# Patient Record
Sex: Female | Born: 1995 | Race: White | Hispanic: No | Marital: Single | Smoking: Never smoker
Health system: Southern US, Community
[De-identification: ages and names within clinical notes are randomized; demographics above are authoritative.]

## PROBLEM LIST (undated history)

## (undated) DIAGNOSIS — J45909 Unspecified asthma, uncomplicated: Secondary | ICD-10-CM

## (undated) HISTORY — PX: TONSILLECTOMY: SUR1361

## (undated) HISTORY — PX: APPENDECTOMY: SHX54

## (undated) HISTORY — PX: WRIST SURGERY: SHX841

---
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
## 2014-01-25 ENCOUNTER — Ambulatory Visit: Payer: Self-pay | Admitting: Family

## 2015-01-26 ENCOUNTER — Ambulatory Visit
Admission: RE | Admit: 2015-01-26 | Discharge: 2015-01-26 | Disposition: A | Discharge status: Home / Self Care | Payer: BLUE CROSS/BLUE SHIELD | Source: Ambulatory Visit | Attending: Family Medicine | Admitting: Family Medicine

## 2015-01-26 ENCOUNTER — Other Ambulatory Visit: Payer: Self-pay | Admitting: Family Medicine

## 2015-01-26 DIAGNOSIS — R52 Pain, unspecified: Secondary | ICD-10-CM

## 2015-01-26 DIAGNOSIS — M79671 Pain in right foot: Secondary | ICD-10-CM | POA: Insufficient documentation

## 2015-11-30 ENCOUNTER — Encounter: Payer: Self-pay | Admitting: Intensive Care

## 2015-11-30 ENCOUNTER — Emergency Department
Admission: EM | Admit: 2015-11-30 | Discharge: 2015-11-30 | Disposition: A | Discharge status: Home / Self Care | Payer: BLUE CROSS/BLUE SHIELD | Attending: Emergency Medicine | Admitting: Emergency Medicine

## 2015-11-30 DIAGNOSIS — J040 Acute laryngitis: Secondary | ICD-10-CM | POA: Diagnosis not present

## 2015-11-30 DIAGNOSIS — J45909 Unspecified asthma, uncomplicated: Secondary | ICD-10-CM | POA: Diagnosis not present

## 2015-11-30 DIAGNOSIS — J029 Acute pharyngitis, unspecified: Secondary | ICD-10-CM | POA: Diagnosis present

## 2015-11-30 HISTORY — DX: Unspecified asthma, uncomplicated: J45.909

## 2015-11-30 LAB — MONONUCLEOSIS SCREEN: Mono Screen: NEGATIVE

## 2015-11-30 LAB — POCT RAPID STREP A: STREPTOCOCCUS, GROUP A SCREEN (DIRECT): NEGATIVE

## 2015-11-30 MED ORDER — PREDNISONE 10 MG (21) PO TBPK: ORAL_TABLET | ORAL | 0 refills | Status: AC

## 2015-11-30 MED ORDER — ACETAMINOPHEN-CODEINE #3 300-30 MG PO TABS: 1.0000 | ORAL_TABLET | ORAL | 0 refills | Status: AC | PRN

## 2015-11-30 NOTE — ED Triage Notes (Signed)
Pt presents to ER with hoarseness and sore throat X 10 days. Pt was treated for strep last week and was put on a 10 day antibiotic (penicillin) and finished it Sunday. Pt states it started to get better but then went back to hurting and has gotten worse. Pt ambulatory in triage with NAD noted. Denies chest pain or trouble swallowing. Painful when swallowing.

## 2015-11-30 NOTE — ED Notes (Signed)
Pt informed to return if any life threatening symptoms occur.  

## 2015-11-30 NOTE — Discharge Instructions (Signed)
Take ibuprofen in addition to the prescription medications written today. Your strep test and mono was negative today. Return to the ER for symptoms that change or worsen if you are unable to see your primary care provider or the ENT specialist.

## 2015-11-30 NOTE — ED Provider Notes (Signed)
[facility] Emergency Department Provider Note  ____________________________________________  Time seen: Approximately 8:17 AM  I have reviewed the triage vital signs and the nursing notes.   HISTORY  Chief Complaint Sore Throat    HPI Kiara Hill is a 20 y.o. female who presents to the emergency department with hoarseness and sore throat x 10 days. She has completed a round of amoxicillin and had felt some better, but symptoms changed overnight and she feels bad again. Today she has another sore throat, hoarseness, and generalized body aches. She did not take any medications prior to arrival.  Past Medical History:  Diagnosis Date  . Asthma     There are no active problems to display for this patient.   Past Surgical History:  Procedure Laterality Date  . APPENDECTOMY    . 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 TONSILLECTOMY    . WRIST SURGERY Left     Prior to Admission medications   Medication Sig Start Date End Date Taking? Authorizing Provider  acetaminophen-codeine (TYLENOL #3) 300-30 MG tablet Take 1-2 tablets by mouth every 4 (four) hours as needed for moderate pain. 11/30/15   Chinita Pester, FNP  predniSONE (STERAPRED UNI-PAK 21 TAB) 10 MG (21) TBPK tablet Take 6 tablets on day 1 Take 5 tablets on day 2 Take 4 tablets on day 3 Take 3 tablets on day 4 Take 2 tablets on day 5 Take 1 tablet on day 6 11/30/15   Chinita Pester, FNP    Allergies Review of patient's allergies indicates no known allergies.  History reviewed. No pertinent family history.  Social History Social History  Substance Use Topics  . Smoking status: Never Smoker  . Smokeless tobacco: Never Used  . Alcohol use Yes     Comment: Socially    Review of Systems Constitutional: Negative for fever. Eyes: No visual changes. ENT: Positive for sore throat; negative for difficulty swallowing. Positive for hoarseness. Respiratory: Denies shortness of  breath. Denies cough. Gastrointestinal: No abdominal pain.  No nausea, no vomiting.  No diarrhea.  Genitourinary: Negative for dysuria. Musculoskeletal: Positive for generalized body aches. Skin: negative for rash. Neurological: Negative for headaches, focal weakness or numbness.  ____________________________________________   PHYSICAL EXAM:  VITAL SIGNS: ED Triage Vitals [11/30/15 0740]  Enc Vitals Group     BP 125/83     Pulse Rate 91     Resp 18     Temp 98.3 F (36.8 C)     Temp Source Oral     SpO2 98 %     Weight 135 lb (61.2 kg)     Height 5\' 10"  (1.778 m)     Head Circumference      Peak Flow      Pain Score 7     Pain Loc      Pain Edu?      Excl. in GC?    Constitutional: Alert and oriented. Well appearing and in no acute distress. Eyes: Conjunctivae are normal. PERRL. EOMI. Head: Atraumatic. Nose: No congestion/rhinnorhea. Mouth/Throat: Mucous membranes are moist. Oropharynx mildly erythematous, without exudate. Neck: No stridor.  Lymphatic: Lymphadenopathy: Tender submandibular nodes without submental node palpable. Cardiovascular: Normal rate, regular rhythm. Good peripheral circulation. Respiratory: Normal respiratory effort. Lungs CTAB. Gastrointestinal: Soft and nontender. Musculoskeletal: No lower extremity tenderness nor edema.   Neurologic:  Normal speech and language. No gross focal neurologic deficits are appreciated. Speech is normal. No gait instability. Skin:  Skin is  warm, dry and intact. No rash noted Psychiatric: Mood and affect are normal. Speech and behavior are normal.  ____________________________________________   LABS (all labs ordered are listed, but only abnormal results are displayed)  Labs Reviewed  MONONUCLEOSIS SCREEN  POCT RAPID STREP A   ____________________________________________  EKG   ____________________________________________  RADIOLOGY  Not  indicated. ____________________________________________   PROCEDURES  Procedure(s) performed: None  Critical Care performed: No  ____________________________________________   INITIAL IMPRESSION / ASSESSMENT AND PLAN / ED COURSE  Clinical Course    Pertinent labs & imaging results that were available during my care of the patient were reviewed by me and considered in my medical decision making (see chart for details).  Rapid strep and Mono is negative. Will prescribe tylenol 3 and prednisone. She is to follow up with ENT for symptoms that are not improving over the weekend. She is to return to the ER for symptoms that change or worsen if unable to schedule an appointment. ____________________________________________   FINAL CLINICAL IMPRESSION(S) / ED DIAGNOSES  Final diagnoses:  Laryngitis  Sore throat    Note:  This document was prepared using Dragon voice recognition software and may include unintentional dictation errors.    Chinita PesterCari B Tanasia Budzinski, FNP 11/30/15 1428    Sharyn CreamerMark Quale, MD 11/30/15 [phone]

## 2017-04-30 IMAGING — CR DG FOOT COMPLETE 3+V*R*
1 series · 3 of 3 positions shown · non-contrast
Comparison: None in PACs

CLINICAL DATA: Right foot pain centered over the fourth and fifth
digits following injury 4 days ago.

EXAM:
RIGHT FOOT COMPLETE - 3+ VIEW

[Series 1: dg foot complete right · 0.14mm/px · 3 of 3 slices shown]
[im 1/3]
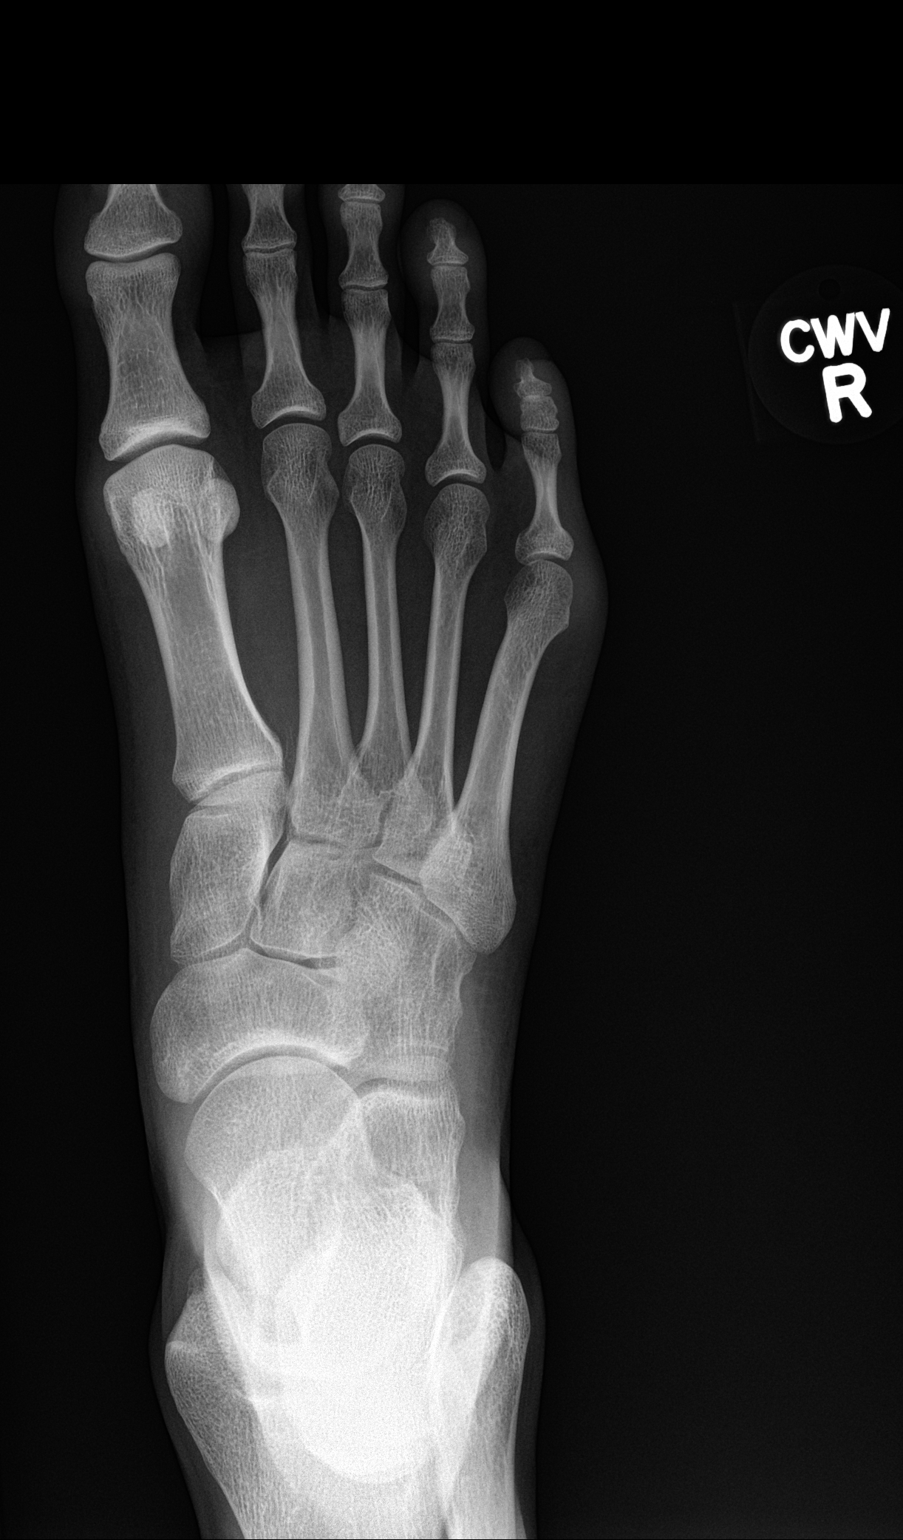
[im 2/3]
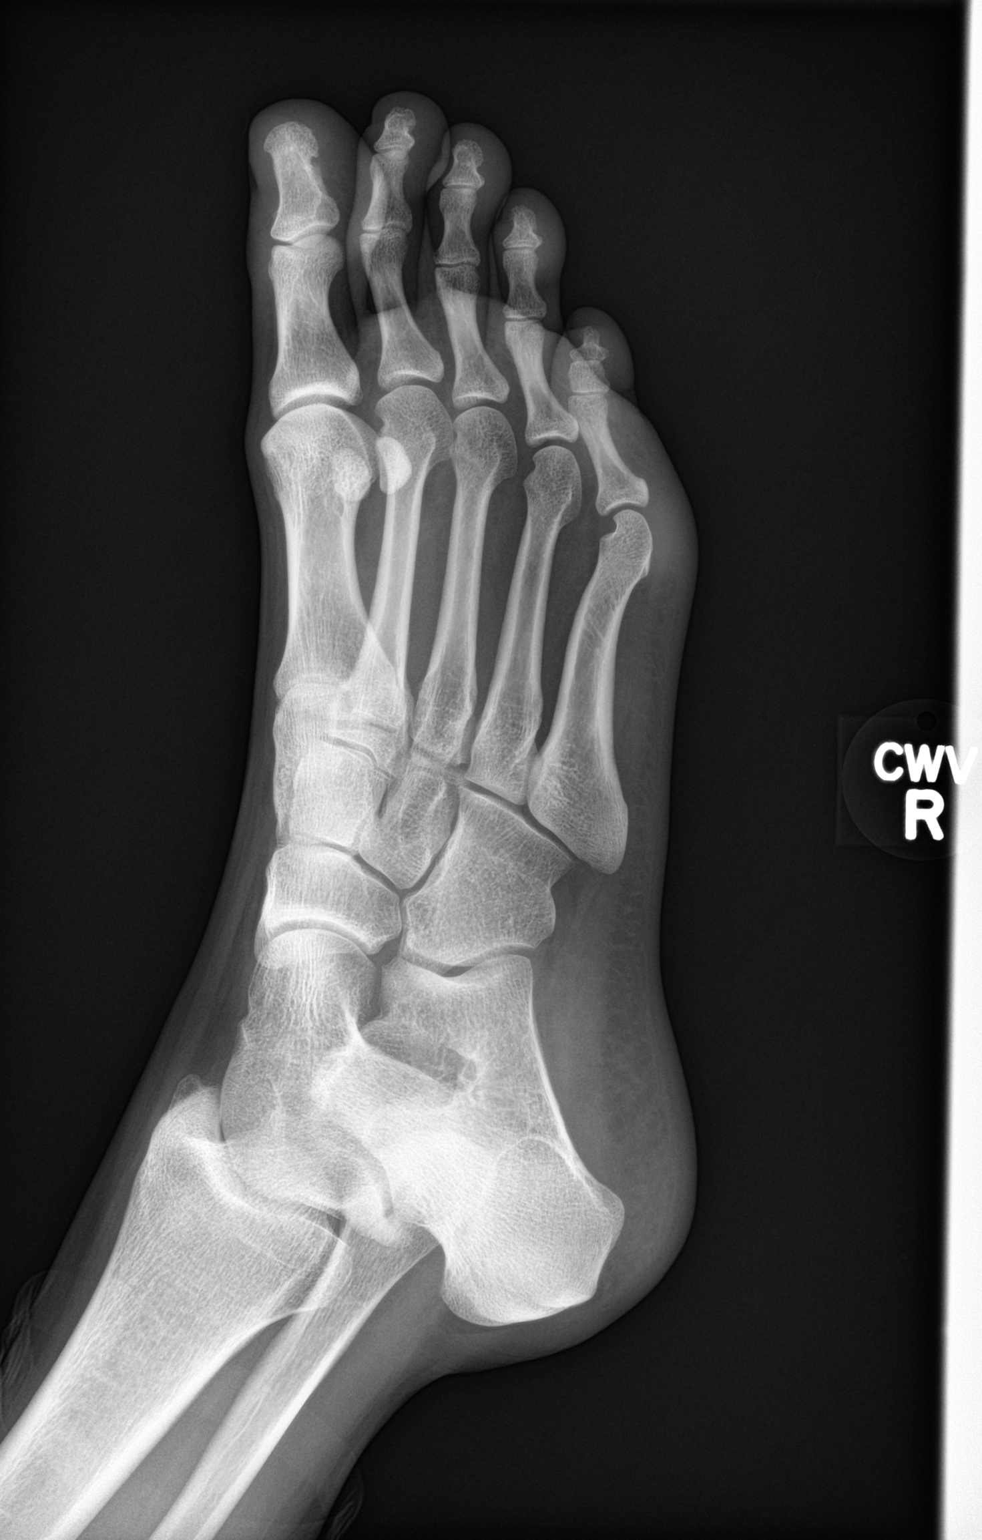
[im 3/3]
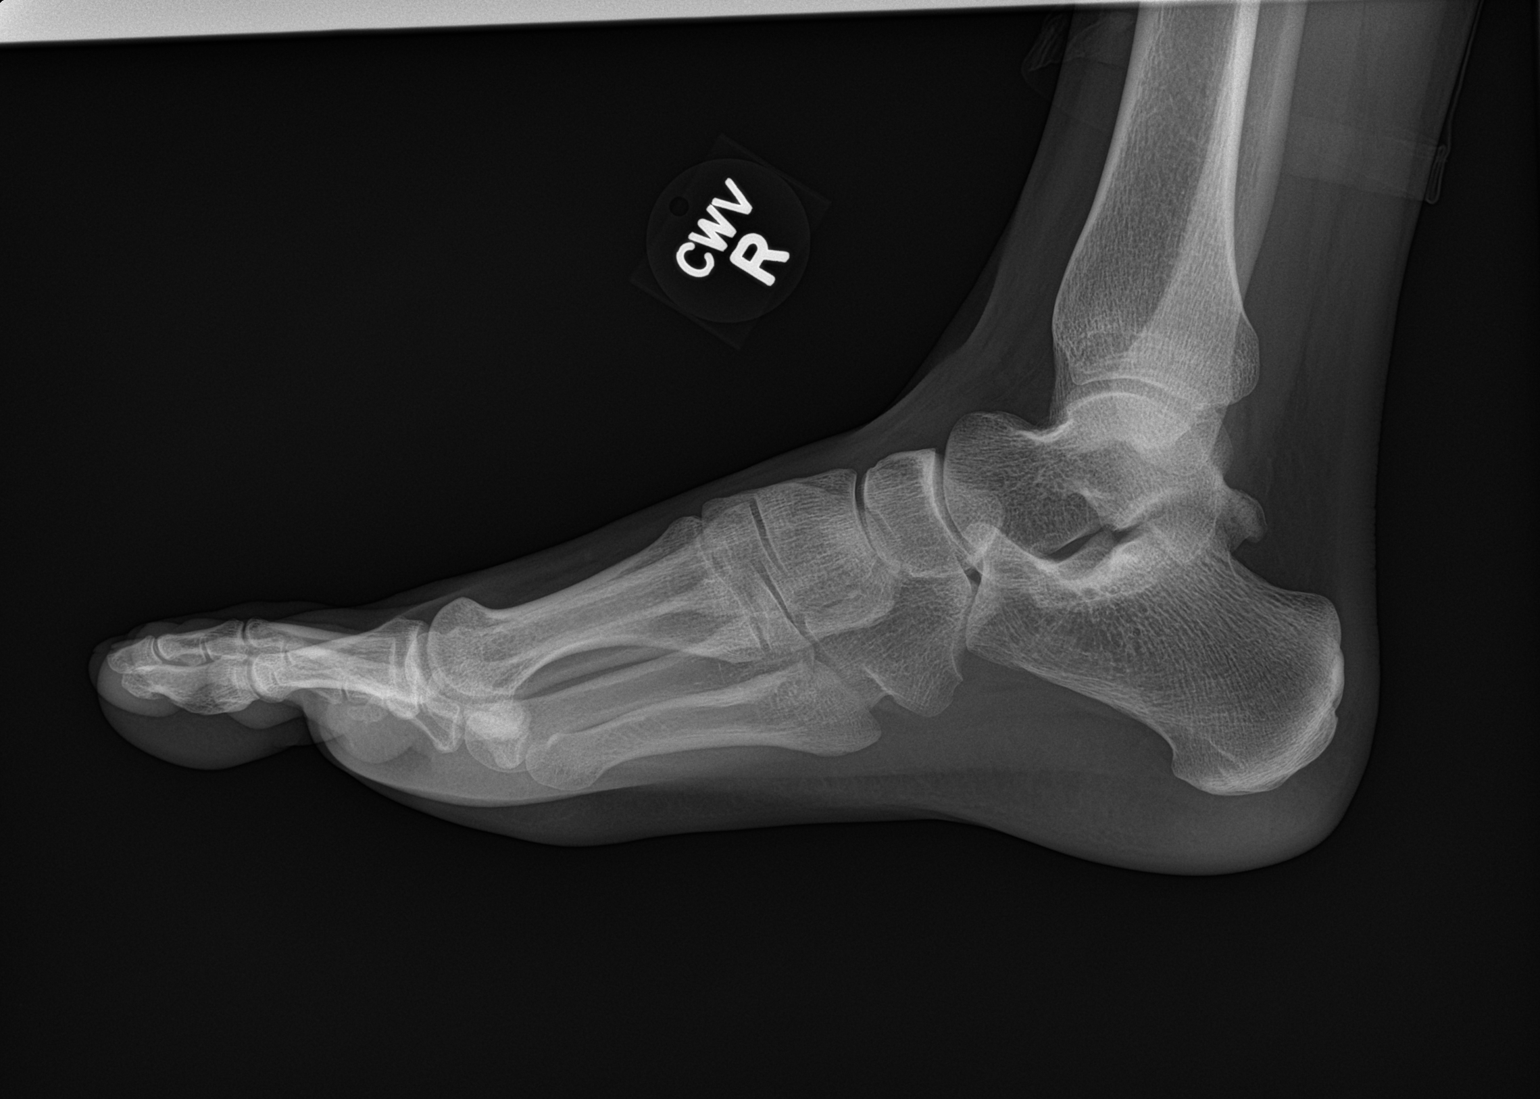

[3 of 3 positions shown; findings below may reference images not displayed]

FINDINGS: The bones of the right foot are adequately mineralized. Specific
attention to the phalanges reveals no acute or old fracture. There
is no lytic or blastic lesion. The joint spaces are preserved. The
tarsals and metatarsals exhibit no acute abnormalities. The soft
tissues are unremarkable.
IMPRESSION: There is no acute bony abnormality of the toes nor elsewhere within
the right foot.
